# Patient Record
Sex: Female | Born: 1970 | Race: White | Hispanic: No | Marital: Married | Smoking: Never smoker
Health system: Southern US, Community
[De-identification: ages and names within clinical notes are randomized; demographics above are authoritative.]

## PROBLEM LIST (undated history)

## (undated) DIAGNOSIS — G2581 Restless legs syndrome: Secondary | ICD-10-CM

## (undated) DIAGNOSIS — IMO0002 Reserved for concepts with insufficient information to code with codable children: Secondary | ICD-10-CM

## (undated) DIAGNOSIS — E039 Hypothyroidism, unspecified: Secondary | ICD-10-CM

## (undated) DIAGNOSIS — R87629 Unspecified abnormal cytological findings in specimens from vagina: Secondary | ICD-10-CM

## (undated) DIAGNOSIS — N3281 Overactive bladder: Secondary | ICD-10-CM

## (undated) DIAGNOSIS — Z8742 Personal history of other diseases of the female genital tract: Secondary | ICD-10-CM

## (undated) DIAGNOSIS — M797 Fibromyalgia: Secondary | ICD-10-CM

## (undated) DIAGNOSIS — N6320 Unspecified lump in the left breast, unspecified quadrant: Principal | ICD-10-CM

## (undated) HISTORY — DX: Fibromyalgia: M79.7

## (undated) HISTORY — DX: Reserved for concepts with insufficient information to code with codable children: IMO0002

## (undated) HISTORY — DX: Unspecified abnormal cytological findings in specimens from vagina: R87.629

## (undated) HISTORY — DX: Restless legs syndrome: G25.81

## (undated) HISTORY — PX: CERVICAL CONE BIOPSY: SUR198

## (undated) HISTORY — PX: CERVICAL CERCLAGE: SHX1329

## (undated) HISTORY — PX: TONSILLECTOMY: SUR1361

## (undated) HISTORY — DX: Personal history of other diseases of the female genital tract: Z87.42

## (undated) HISTORY — PX: DILATION AND CURETTAGE OF UTERUS: SHX78

## (undated) HISTORY — DX: Unspecified lump in the left breast, unspecified quadrant: N63.20

## (undated) HISTORY — DX: Overactive bladder: N32.81

## (undated) HISTORY — DX: Hypothyroidism, unspecified: E03.9

---

## 2012-10-26 ENCOUNTER — Other Ambulatory Visit (HOSPITAL_COMMUNITY)
Admission: RE | Admit: 2012-10-26 | Discharge: 2012-10-26 | Disposition: A | Discharge status: Home / Self Care | Payer: BC Managed Care – PPO | Source: Ambulatory Visit | Attending: Obstetrics and Gynecology | Admitting: Obstetrics and Gynecology

## 2012-10-26 DIAGNOSIS — Z01419 Encounter for gynecological examination (general) (routine) without abnormal findings: Secondary | ICD-10-CM | POA: Insufficient documentation

## 2012-10-26 DIAGNOSIS — Z1151 Encounter for screening for human papillomavirus (HPV): Secondary | ICD-10-CM | POA: Insufficient documentation

## 2012-12-27 ENCOUNTER — Encounter: Payer: Self-pay | Admitting: Adult Health

## 2012-12-27 ENCOUNTER — Ambulatory Visit (INDEPENDENT_AMBULATORY_CARE_PROVIDER_SITE_OTHER): Payer: BC Managed Care – PPO | Admitting: Adult Health

## 2012-12-27 VITALS — BP 120/78 | Ht 67.0 in | Wt 178.0 lb

## 2012-12-27 DIAGNOSIS — N6320 Unspecified lump in the left breast, unspecified quadrant: Secondary | ICD-10-CM | POA: Insufficient documentation

## 2012-12-27 DIAGNOSIS — N63 Unspecified lump in unspecified breast: Secondary | ICD-10-CM

## 2012-12-27 HISTORY — DX: Unspecified lump in the left breast, unspecified quadrant: N63.20

## 2012-12-27 NOTE — Assessment & Plan Note (Signed)
Schedule diagnostic bilateral mammogram and left breast US for 5/14 at [facility]

## 2012-12-27 NOTE — Progress Notes (Signed)
Subjective:     Patient ID: Briana Hunt, female   DOB: 15-Feb-1971, 42 y.o.   MRN: [id number]  HPI [name] is a 42 year old white female who found a lump in her left breast Friday night. 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  Review of SystemsComplaints as above in HPI Reviewed past medical,surgical, social and family history. Reviewed medications and allergies.      Objective:   Physical Exam Blood pressure 120/78, height 5\' 7"  (1.702 m), weight 178 lb (80.74 kg), last menstrual period 12/12/2012. Skin warm and dry. Breast: no dominant palpable mass, retraction or nipple discharge on right, on the left there is a firm, mobile, oval 3.5 x 5 cm mass at 10 to 1 o'clock position, 1 finger breath from areola, no retraction or nipple discharge.    Assessment:       Left breast mass     Plan:      Diagnostic bilateral mammogram and left breast US scheduled for 5/14 at 8:30 am at [facility], will follow up by phone

## 2012-12-27 NOTE — Patient Instructions (Addendum)
Mammogram is 5/14 at 8:30 am at [facility], be there at 8:15 am to register Sign up for up chart

## 2013-01-02 ENCOUNTER — Encounter (HOSPITAL_COMMUNITY): Payer: BC Managed Care – PPO

## 2013-01-09 ENCOUNTER — Ambulatory Visit (HOSPITAL_COMMUNITY)
Admission: RE | Admit: 2013-01-09 | Discharge: 2013-01-09 | Disposition: A | Discharge status: Home / Self Care | Payer: BC Managed Care – PPO | Source: Ambulatory Visit | Attending: Adult Health | Admitting: Adult Health

## 2013-01-09 DIAGNOSIS — N6320 Unspecified lump in the left breast, unspecified quadrant: Secondary | ICD-10-CM

## 2013-01-09 DIAGNOSIS — N6009 Solitary cyst of unspecified breast: Secondary | ICD-10-CM | POA: Insufficient documentation

## 2013-01-09 DIAGNOSIS — N63 Unspecified lump in unspecified breast: Secondary | ICD-10-CM | POA: Insufficient documentation

## 2013-11-05 ENCOUNTER — Encounter (INDEPENDENT_AMBULATORY_CARE_PROVIDER_SITE_OTHER): Payer: Self-pay

## 2013-11-05 ENCOUNTER — Ambulatory Visit (INDEPENDENT_AMBULATORY_CARE_PROVIDER_SITE_OTHER): Payer: BC Managed Care – PPO | Admitting: Adult Health

## 2013-11-05 ENCOUNTER — Encounter: Payer: Self-pay | Admitting: Adult Health

## 2013-11-05 VITALS — BP 106/64 | HR 76 | Ht 67.0 in | Wt 182.0 lb

## 2013-11-05 DIAGNOSIS — Z01419 Encounter for gynecological examination (general) (routine) without abnormal findings: Secondary | ICD-10-CM

## 2013-11-05 DIAGNOSIS — Z1212 Encounter for screening for malignant neoplasm of rectum: Secondary | ICD-10-CM

## 2013-11-05 LAB — HEMOCCULT GUIAC POC 1CARD (OFFICE): Fecal Occult Blood, POC: NEGATIVE

## 2013-11-05 NOTE — Patient Instructions (Signed)
Physical in 1 year Mammogram yearly this year after 5/1 call [phone] Labs with Dr Margo AyeHall

## 2013-11-05 NOTE — Progress Notes (Signed)
Patient ID: Rhea PinkBrandy Chiriboga, female   DOB: 07/02/1971, 43 y.o.   MRN: [id number] History of Present Illness: Gearldine BienenstockBrandy is a 43 year old white female in for physical, she had a normal pap with negative HPV 10/26/12. Has RLS and hypothyroid and sees Dr Margo AyeHall and she got flu shot there last year.  Current Medications, Allergies, Past Medical History, Past Surgical History, Family History and Social History were reviewed in [facility] electronic medical record.     Review of Systems: Patient denies any headaches, blurred vision, shortness of breath, chest pain, abdominal pain, problems with bowel movements, urination, or intercourse. No joint swelling or mood swings, she is going back to school.    Physical Exam:BP 106/64  Pulse 76  Ht 5\' 7"  (1.702 m)  Wt 182 lb (82.555 kg)  BMI 28.50 kg/m2  LMP 10/29/2013 General:  Well developed, well nourished, no acute distress Skin:  Warm and dry Neck:  Midline trachea, normal thyroid Lungs; Clear to auscultation bilaterally Breast:  No dominant palpable mass, retraction, or nipple discharge Cardiovascular: Regular rate and rhythm Abdomen:  Soft, non tender, no hepatosplenomegaly Pelvic:  External genitalia is normal in appearance.  The vagina is normal in appearance.  The cervix is bulbous.  Uterus is felt to be normal size, shape, and contour.  No  adnexal masses or tenderness noted. Rectal: Good sphincter tone, no polyps, or hemorrhoids felt.  Hemoccult negative. Extremities:  No swelling or varicosities noted Psych:  No mood changes, alert and cooperative, seems happy We discussed changes after 42, hair on chin, periods and thicker waists   Impression: Yearly gyn exam no pap    Plan: Physical in 1 year Mammogram yearly Get this year after 5/1 Labs with Dr Margo AyeHall  Call prn

## 2013-12-04 ENCOUNTER — Encounter: Payer: Self-pay | Admitting: Adult Health

## 2014-04-18 ENCOUNTER — Other Ambulatory Visit: Payer: Self-pay | Admitting: Adult Health

## 2014-04-18 DIAGNOSIS — Z1231 Encounter for screening mammogram for malignant neoplasm of breast: Secondary | ICD-10-CM

## 2014-04-21 ENCOUNTER — Ambulatory Visit (HOSPITAL_COMMUNITY)
Admission: RE | Admit: 2014-04-21 | Discharge: 2014-04-21 | Disposition: A | Discharge status: Home / Self Care | Payer: 59 | Source: Ambulatory Visit | Attending: Adult Health | Admitting: Adult Health

## 2014-04-21 DIAGNOSIS — Z1231 Encounter for screening mammogram for malignant neoplasm of breast: Secondary | ICD-10-CM | POA: Insufficient documentation

## 2014-06-30 ENCOUNTER — Encounter: Payer: Self-pay | Admitting: Adult Health

## 2015-03-24 ENCOUNTER — Ambulatory Visit (INDEPENDENT_AMBULATORY_CARE_PROVIDER_SITE_OTHER): Payer: 59 | Admitting: Adult Health

## 2015-03-24 ENCOUNTER — Other Ambulatory Visit (HOSPITAL_COMMUNITY)
Admission: RE | Admit: 2015-03-24 | Discharge: 2015-03-24 | Disposition: A | Discharge status: Home / Self Care | Payer: 59 | Source: Ambulatory Visit | Attending: Adult Health | Admitting: Adult Health

## 2015-03-24 ENCOUNTER — Encounter: Payer: Self-pay | Admitting: Adult Health

## 2015-03-24 VITALS — BP 100/76 | HR 80 | Ht 67.0 in | Wt 201.0 lb

## 2015-03-24 DIAGNOSIS — Z01419 Encounter for gynecological examination (general) (routine) without abnormal findings: Secondary | ICD-10-CM | POA: Diagnosis not present

## 2015-03-24 DIAGNOSIS — Z1151 Encounter for screening for human papillomavirus (HPV): Secondary | ICD-10-CM | POA: Diagnosis present

## 2015-03-24 DIAGNOSIS — Z1212 Encounter for screening for malignant neoplasm of rectum: Secondary | ICD-10-CM

## 2015-03-24 DIAGNOSIS — Z8742 Personal history of other diseases of the female genital tract: Secondary | ICD-10-CM

## 2015-03-24 HISTORY — DX: Personal history of other diseases of the female genital tract: Z87.42

## 2015-03-24 LAB — HEMOCCULT GUIAC POC 1CARD (OFFICE): Fecal Occult Blood, POC: NEGATIVE

## 2015-03-24 NOTE — Progress Notes (Signed)
Patient ID: Briana Hunt, female   DOB: Jan 17, 1971, 44 y.o.   MRN: [id number] History of Present Illness: Briana Hunt is a 44 year old white female, married in for well woman gyn exam and pap.Last pap 10/26/12 was normal with negative HPV.She has history of abnormal pap.She says she needs to work on her weight, but mom(PAD AND DM) and uncle(DM) passed away this summer and she has been on vacation and has ate too much.She is aware can go 3 years on pap with negative HPV, but she wants 1 now. PCP is [facility].  Current Medications, Allergies, Past Medical History, Past Surgical History, Family History and Social History were reviewed in [facility] record.     Review of Systems: Patient denies any headaches, hearing loss, fatigue, blurred vision, shortness of breath, chest pain, abdominal pain, problems with bowel movements, urination, or intercourse. No joint pain or mood swings.Has body aches from fibromyalgia.    Physical Exam:BP 100/76 mmHg  Pulse 80  Ht  (1.702 m)  Wt 201 lb (91.173 kg)  BMI 31.47 kg/m2  LMP 03/12/2015 General:  Well developed, well nourished, no acute distress Skin:  Warm and dry Neck:  Midline trachea, normal thyroid, good ROM, no lymphadenopathy Lungs; Clear to auscultation bilaterally Breast:  No dominant palpable mass, retraction, or nipple discharge Cardiovascular: Regular rate and rhythm Abdomen:  Soft, non tender, no hepatosplenomegaly Pelvic:  External genitalia is normal in appearance, no lesions.  The vagina is normal in appearance. Urethra has no lesions or masses. The cervix is bulbous.Pap with HPV performed.  Uterus is felt to be normal size, shape, and contour.  No adnexal masses or tenderness noted.Bladder is non tender, no masses felt. Rectal: Good sphincter tone, no polyps, or hemorrhoids felt.  Hemoccult negative. Extremities/musculoskeletal:  No swelling or varicosities noted, no clubbing or cyanosis Psych:  No mood changes,  alert and cooperative,seems happy   Impression: Well woman gyn exam with pap History of abnormal pap    Plan: Physical in 1 year Mammogram in August and yearly, to get 3D Labs with PCP Colonoscopy at 52

## 2015-03-24 NOTE — Patient Instructions (Signed)
Physical in 1 year Mammogram in august and yearly Labs with PCP

## 2015-03-26 LAB — CYTOLOGY - PAP

## 2015-04-15 ENCOUNTER — Other Ambulatory Visit: Payer: Self-pay | Admitting: Adult Health

## 2015-04-15 ENCOUNTER — Telehealth: Payer: Self-pay | Admitting: Adult Health

## 2015-04-15 DIAGNOSIS — N6001 Solitary cyst of right breast: Secondary | ICD-10-CM

## 2015-04-15 NOTE — Telephone Encounter (Signed)
Left message that order done

## 2015-04-20 ENCOUNTER — Other Ambulatory Visit: Payer: 59

## 2015-04-23 ENCOUNTER — Ambulatory Visit
Admission: RE | Admit: 2015-04-23 | Discharge: 2015-04-23 | Disposition: A | Discharge status: Home / Self Care | Payer: 59 | Source: Ambulatory Visit | Attending: Adult Health | Admitting: Adult Health

## 2015-04-23 DIAGNOSIS — N6001 Solitary cyst of right breast: Secondary | ICD-10-CM

## 2015-07-06 ENCOUNTER — Emergency Department (HOSPITAL_COMMUNITY): Payer: Worker's Compensation

## 2015-07-06 ENCOUNTER — Emergency Department (HOSPITAL_COMMUNITY)
Admission: EM | Admit: 2015-07-06 | Discharge: 2015-07-06 | Disposition: A | Discharge status: Home / Self Care | Payer: Worker's Compensation | Attending: Emergency Medicine | Admitting: Emergency Medicine

## 2015-07-06 ENCOUNTER — Encounter (HOSPITAL_COMMUNITY): Payer: Self-pay | Admitting: Emergency Medicine

## 2015-07-06 DIAGNOSIS — S83005A Unspecified dislocation of left patella, initial encounter: Secondary | ICD-10-CM | POA: Insufficient documentation

## 2015-07-06 DIAGNOSIS — Z8742 Personal history of other diseases of the female genital tract: Secondary | ICD-10-CM | POA: Insufficient documentation

## 2015-07-06 DIAGNOSIS — Y9289 Other specified places as the place of occurrence of the external cause: Secondary | ICD-10-CM | POA: Insufficient documentation

## 2015-07-06 DIAGNOSIS — G2581 Restless legs syndrome: Secondary | ICD-10-CM | POA: Insufficient documentation

## 2015-07-06 DIAGNOSIS — X58XXXA Exposure to other specified factors, initial encounter: Secondary | ICD-10-CM | POA: Diagnosis not present

## 2015-07-06 DIAGNOSIS — Y998 Other external cause status: Secondary | ICD-10-CM | POA: Insufficient documentation

## 2015-07-06 DIAGNOSIS — Y9389 Activity, other specified: Secondary | ICD-10-CM | POA: Insufficient documentation

## 2015-07-06 DIAGNOSIS — Z8739 Personal history of other diseases of the musculoskeletal system and connective tissue: Secondary | ICD-10-CM | POA: Insufficient documentation

## 2015-07-06 DIAGNOSIS — E039 Hypothyroidism, unspecified: Secondary | ICD-10-CM | POA: Insufficient documentation

## 2015-07-06 DIAGNOSIS — S8992XA Unspecified injury of left lower leg, initial encounter: Secondary | ICD-10-CM | POA: Diagnosis present

## 2015-07-06 MED ORDER — IBUPROFEN 800 MG PO TABS
800.0000 mg | ORAL_TABLET | Freq: Once | ORAL | Status: AC
  Administered 2015-07-06: 800 mg via ORAL
  Filled 2015-07-06: qty 1

## 2015-07-06 MED ORDER — IBUPROFEN 800 MG PO TABS: 800.0000 mg | ORAL_TABLET | Freq: Three times a day (TID) | ORAL | Status: DC

## 2015-07-06 NOTE — ED Notes (Signed)
Pt made aware to return if symptoms worsen or if any life threatening symptoms occur.   

## 2015-07-06 NOTE — ED Provider Notes (Signed)
CSN: [id number]     Arrival date & time 07/06/15  1104 History  By signing my name below, I, Briana Hunt, attest that this documentation has been prepared under the direction and in the presence of Eber Hong, MD. Electronically Signed: Soijett Hunt, ED Scribe. 07/06/2015. 12:20 PM.   Chief Complaint  Patient presents with  . Knee Injury     The history is provided by the patient. No language interpreter was used.     Briana Hunt is a 44 y.o. female who presents to the Emergency Department complaining of  onset moderate left knee injury. 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 She reports that she put the knee back in place and she is now having pain to the area. Denies any recent trauma/injury/fall/MVC. She reports that she has not been weight bearing since the incident. Pt is having associated symptoms of tingling and left knee swelling. She notes that she has not tried any medications for the relief of her symptoms. She denies and any other symptoms.   Past Medical History  Diagnosis Date  . Hypothyroidism   . Fibromyalgia   . Restless leg syndrome   . Abnormal Pap smear   . Breast mass, left 12/27/2012  . Vaginal Pap smear, abnormal   . History of abnormal cervical Pap smear 03/24/2015   Past Surgical History  Procedure Laterality Date  . Dilation and curettage of uterus    . Cervical cone biopsy    . Tonsillectomy    . Cervical cerclage      x 2   Family History  Problem Relation Age of Onset  . Hypertension Mother   . Diabetes Mother   . Heart disease Mother   . Heart disease Father   . Cancer Father     lung and colon  . Diabetes Father   . Hyperlipidemia Sister   . Hypertension Sister   . Diabetes Paternal Uncle    Social History  Substance Use Topics  . Smoking status: Never Smoker   . Smokeless tobacco: Never Used  . Alcohol Use: No   OB History    Gravida Para Term  Preterm AB TAB SAB Ectopic Multiple Living   Review of Systems  Musculoskeletal: Positive for joint swelling and arthralgias.  Neurological: Negative for numbness.  All other systems reviewed and are negative.     Allergies  Review of patient's allergies indicates no known allergies.  Home Medications   Prior to Admission medications   Medication Sig Start Date End Date Taking? Authorizing Provider  ARMOUR THYROID 60 MG tablet Take 60 mg by mouth at bedtime.  05/15/15  Yes Historical Provider, MD  rOPINIRole (REQUIP) 2 MG tablet Take 3 mg by mouth at bedtime.  10/12/13  Yes Historical Provider, MD  ibuprofen (ADVIL,MOTRIN) 800 MG tablet Take 1 tablet (800 mg total) by mouth 3 (three) times daily. 07/06/15   Eber Hong, MD   BP 119/65 mmHg  Pulse 90  Temp(Src) 97.6 F (36.4 C) (Oral)  Resp 20  Ht  (1.702 m)  Wt 192 lb (87.091 kg)  BMI 30.06 kg/m2  SpO2 100%  LMP 06/30/2015 Physical Exam  Constitutional: She is oriented to person, place, and time. She appears well-developed and well-nourished. No distress.  HENT:  Head: Normocephalic and atraumatic.  Eyes:  EOM are normal.  Neck: Neck supple.  Cardiovascular: Normal rate, regular rhythm and normal heart sounds.  Exam reveals no gallop and no friction rub.   No murmur heard. Pulmonary/Chest: Effort normal. No respiratory distress.  Musculoskeletal: Normal range of motion.  Slight swelling but preserved ability to SLR. decreased ROM secondary to pain of the knee. Nl pulses at the feet.   Neurological: She is alert and oriented to person, place, and time. She has normal strength. No sensory deficit.  Nl sensation and strength at all joins of the left lower extremity.   Skin: Skin is warm and dry.  Psychiatric: She has a normal mood and affect. Her behavior is normal.  Nursing note and vitals reviewed.   ED Course  Procedures (including critical care time) DIAGNOSTIC STUDIES: Oxygen Saturation is  100% on RA, nl by my interpretation.    COORDINATION OF CARE: 12:20 PM Discussed treatment plan with pt at bedside which includes left knee xray, knee immobilizer, ice, ibuprofen, and referral to orthopedist PRN and pt agreed to plan.    Labs Review Labs Reviewed - No data to display  Imaging Review Dg Knee Complete 4 Views Left  07/06/2015  CLINICAL DATA:  The patient's left knee popped when standing today. Left knee pain. Initial encounter. EXAM: LEFT KNEE - COMPLETE 4+ VIEW COMPARISON:  None. FINDINGS: There is no evidence of fracture, dislocation, or joint effusion. There is no evidence of arthropathy or other focal bone abnormality. Soft tissues are unremarkable. IMPRESSION: Negative exam. Electronically Signed   By: Drusilla Kannerhomas  Dalessio M.D.   On: 07/06/2015 12:21   I have personally reviewed and evaluated these images as part of my medical decision-making.    MDM   Final diagnoses:  Patellar dislocation, left, initial encounter    The patient has had a spontaneous patellar dislocation and relocation, her knee appears relatively normal on exam, I have personally viewed and interpreted the x-rays and find her to be no fractures or dislocations present. She has been informed of these results, rice therapy initiated, the patient is stable for discharge to follow-up with orthopedics in the community.  I have personally viewed and interpreted the imaging and agree with radiologist interpretation.   I personally performed the services described in this documentation, which was scribed in my presence. The recorded information has been reviewed and is accurate.        Eber HongBrian Jakki Doughty, MD 07/06/15 [phone]

## 2015-07-06 NOTE — Discharge Instructions (Signed)

## 2015-07-06 NOTE — ED Notes (Signed)
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  Left knee popped while standing.

## 2015-07-14 ENCOUNTER — Ambulatory Visit: Payer: BC Managed Care – PPO | Admitting: Orthopedic Surgery

## 2016-06-14 ENCOUNTER — Other Ambulatory Visit: Payer: Self-pay | Admitting: Adult Health

## 2016-06-14 DIAGNOSIS — Z1231 Encounter for screening mammogram for malignant neoplasm of breast: Secondary | ICD-10-CM

## 2016-07-01 ENCOUNTER — Ambulatory Visit
Admission: RE | Admit: 2016-07-01 | Discharge: 2016-07-01 | Disposition: A | Discharge status: Home / Self Care | Payer: No Typology Code available for payment source | Source: Ambulatory Visit | Attending: Adult Health | Admitting: Adult Health

## 2016-07-01 DIAGNOSIS — Z1231 Encounter for screening mammogram for malignant neoplasm of breast: Secondary | ICD-10-CM

## 2017-01-13 ENCOUNTER — Ambulatory Visit (INDEPENDENT_AMBULATORY_CARE_PROVIDER_SITE_OTHER): Payer: BLUE CROSS/BLUE SHIELD | Admitting: Adult Health

## 2017-01-13 ENCOUNTER — Encounter: Payer: Self-pay | Admitting: Adult Health

## 2017-01-13 ENCOUNTER — Other Ambulatory Visit (HOSPITAL_COMMUNITY)
Admission: RE | Admit: 2017-01-13 | Discharge: 2017-01-13 | Disposition: A | Discharge status: Home / Self Care | Payer: BLUE CROSS/BLUE SHIELD | Source: Ambulatory Visit | Attending: Adult Health | Admitting: Adult Health

## 2017-01-13 VITALS — BP 110/60 | HR 67 | Ht 67.5 in | Wt 193.5 lb

## 2017-01-13 DIAGNOSIS — Z01419 Encounter for gynecological examination (general) (routine) without abnormal findings: Secondary | ICD-10-CM | POA: Insufficient documentation

## 2017-01-13 DIAGNOSIS — Z8742 Personal history of other diseases of the female genital tract: Secondary | ICD-10-CM

## 2017-01-13 DIAGNOSIS — Z1212 Encounter for screening for malignant neoplasm of rectum: Secondary | ICD-10-CM | POA: Diagnosis not present

## 2017-01-13 DIAGNOSIS — Z1211 Encounter for screening for malignant neoplasm of colon: Secondary | ICD-10-CM | POA: Diagnosis not present

## 2017-01-13 LAB — HEMOCCULT GUIAC POC 1CARD (OFFICE): Fecal Occult Blood, POC: NEGATIVE

## 2017-01-13 NOTE — Progress Notes (Signed)
Patient ID: Briana Hunt, female   DOB: [birth year], 46 y.o.   MRN: [id number] History of Present Illness: Briana BienenstockBrandy is a 46 year old white female, married, G2P2 in for well woman gyn exam and pap. PCP is Dr Margo AyeHall.    Current Medications, Allergies, Past Medical History, Past Surgical History, Family History and Social History were reviewed in [facility] Link electronic medical record.     Review of Systems: Patient denies any headaches, hearing loss, fatigue, blurred vision, shortness of breath, chest pain, abdominal pain, problems with bowel movements, urination, or intercourse. No joint pain or mood swings.    Physical Exam:BP 110/60 (BP Location: Left Arm, Patient Position: Sitting, Cuff Size: Normal)   Pulse 67   Ht 5' 7.5" (1.715 m)   Wt 193 lb 8 oz (87.8 kg)   LMP 01/07/2017 (Exact Date)   BMI 29.86 kg/m  General:  Well developed, well nourished, no acute distress Skin:  Warm and dry Neck:  Midline trachea, normal thyroid, good ROM, no lymphadenopathy Lungs; Clear to auscultation bilaterally Breast:  No dominant palpable mass, retraction, or nipple discharge Cardiovascular: Regular rate and rhythm Abdomen:  Soft, non tender, no hepatosplenomegaly Pelvic:  External genitalia is normal in appearance, no lesions.  The vagina is normal in appearance. Urethra has no lesions or masses. The cervix is bulbous,pap with HPV performed.  Uterus is felt to be normal size, shape, and contour.  No adnexal masses or tenderness noted.Bladder is non tender, no masses felt. Rectal: Good sphincter tone, no polyps, or hemorrhoids felt.  Hemoccult negative. Extremities/musculoskeletal:  No swelling or varicosities noted, no clubbing or cyanosis Psych:  No mood changes, alert and cooperative,seems happy PHQ 2 score 0.  Impression: 1. Encounter for gynecological examination with Papanicolaou smear of cervix   2. Screening for colorectal cancer   3. History of abnormal cervical Pap smear        Plan:  Physical in 1 year, pap in 3 if normal Mammogram yearly Labs with PCP Colonoscopy at 52

## 2017-01-16 LAB — CYTOLOGY - PAP
Diagnosis: NEGATIVE
HPV: NOT DETECTED

## 2017-08-09 IMAGING — DX DG KNEE COMPLETE 4+V*L*
4 series · 4 of 4 positions shown · non-contrast
Comparison: None.

CLINICAL DATA: The patient's left knee popped when standing today.
Left knee pain. Initial encounter.

EXAM:
LEFT KNEE - COMPLETE 4+ VIEW

[knee ap]
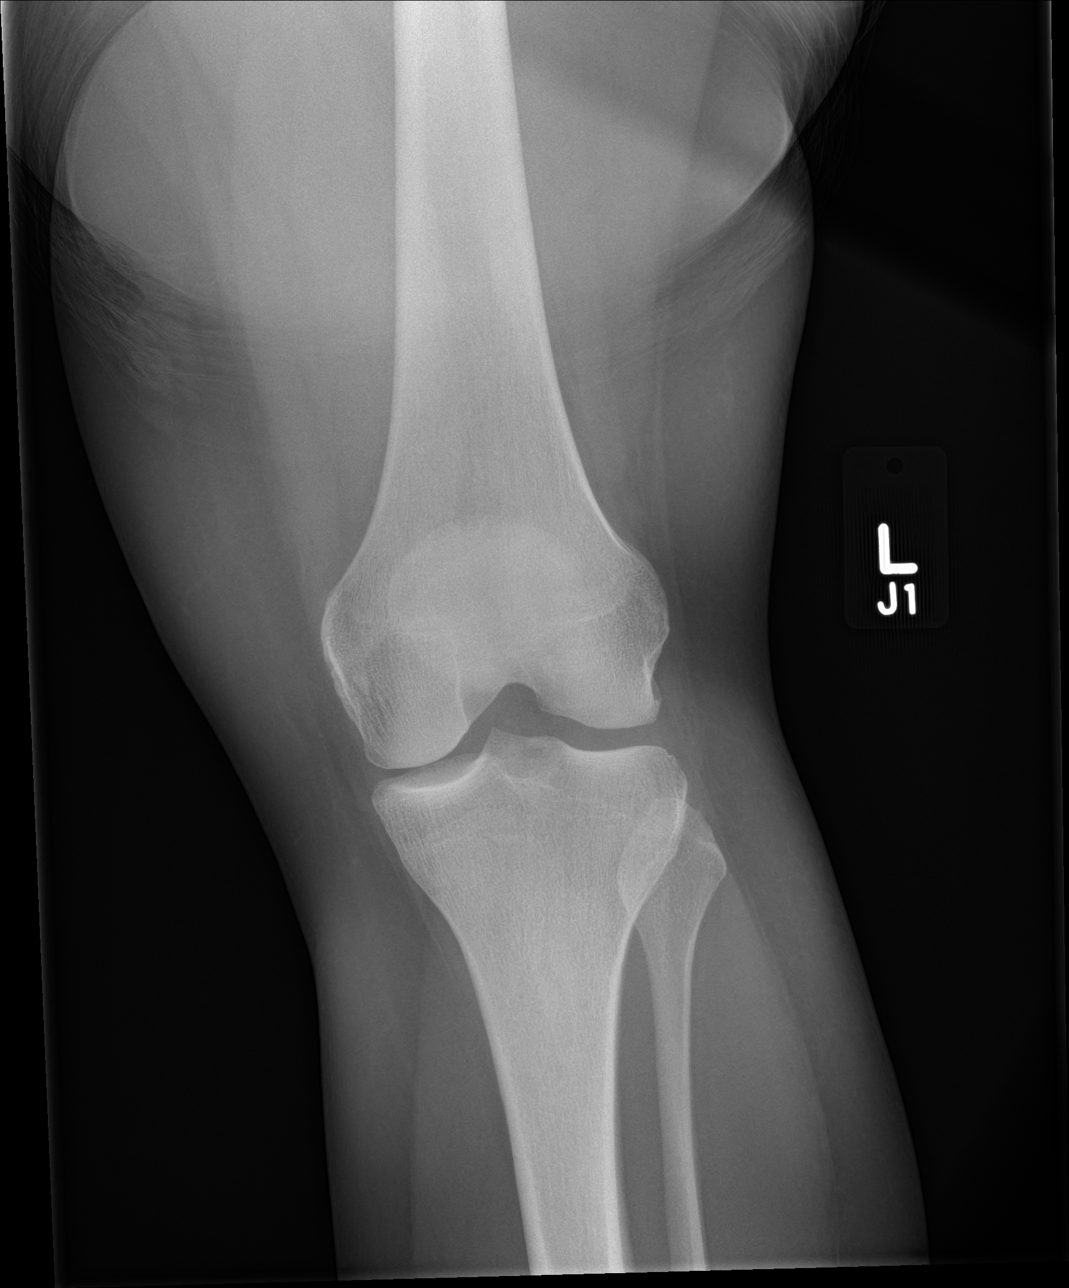

[knee obl (1 of 2)]
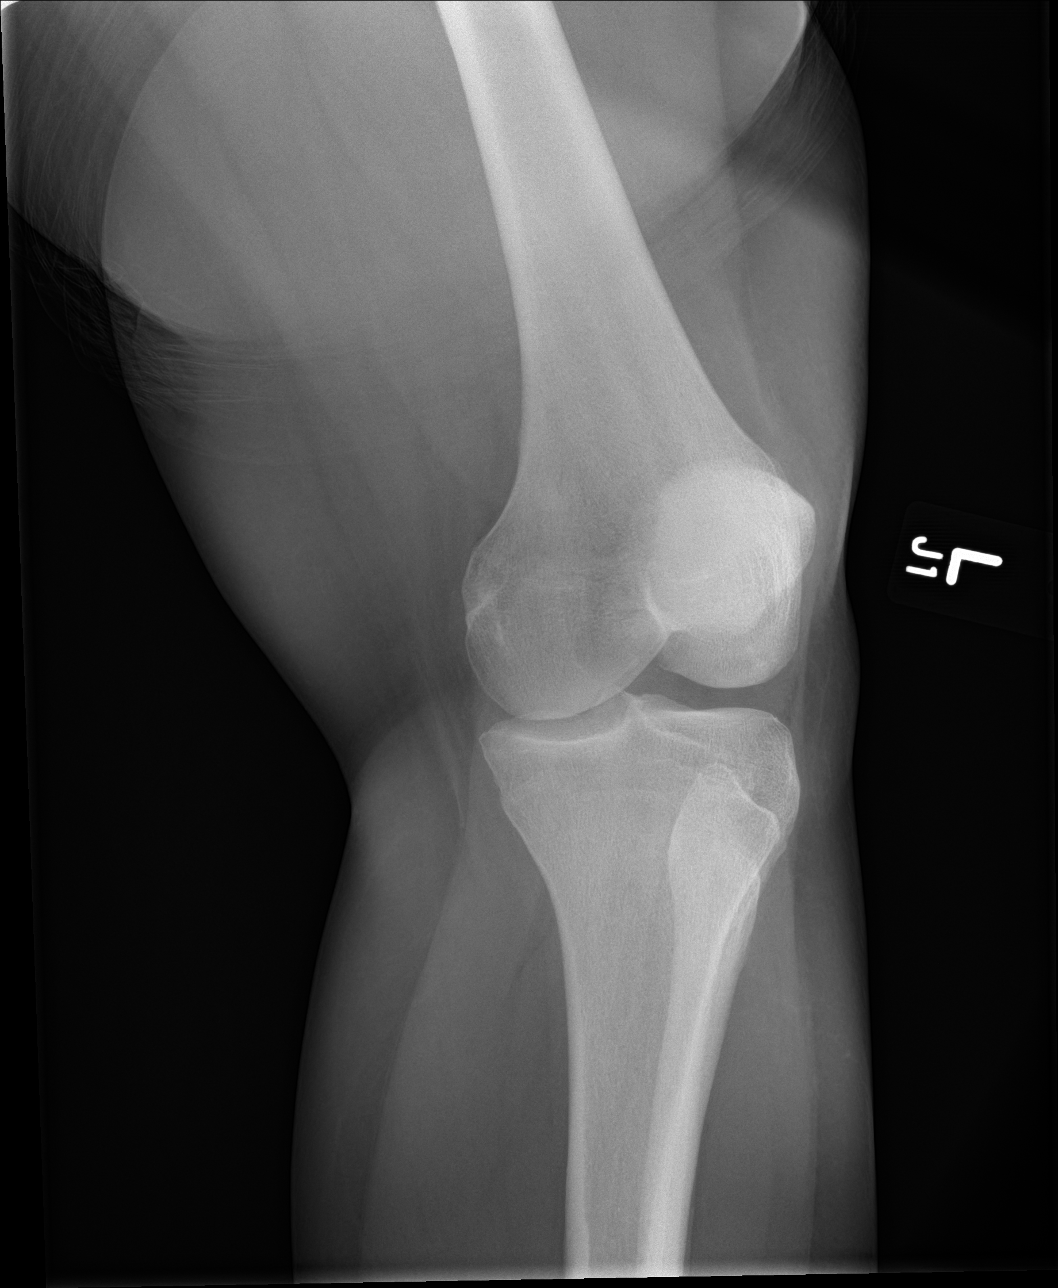

[knee obl (2 of 2)]
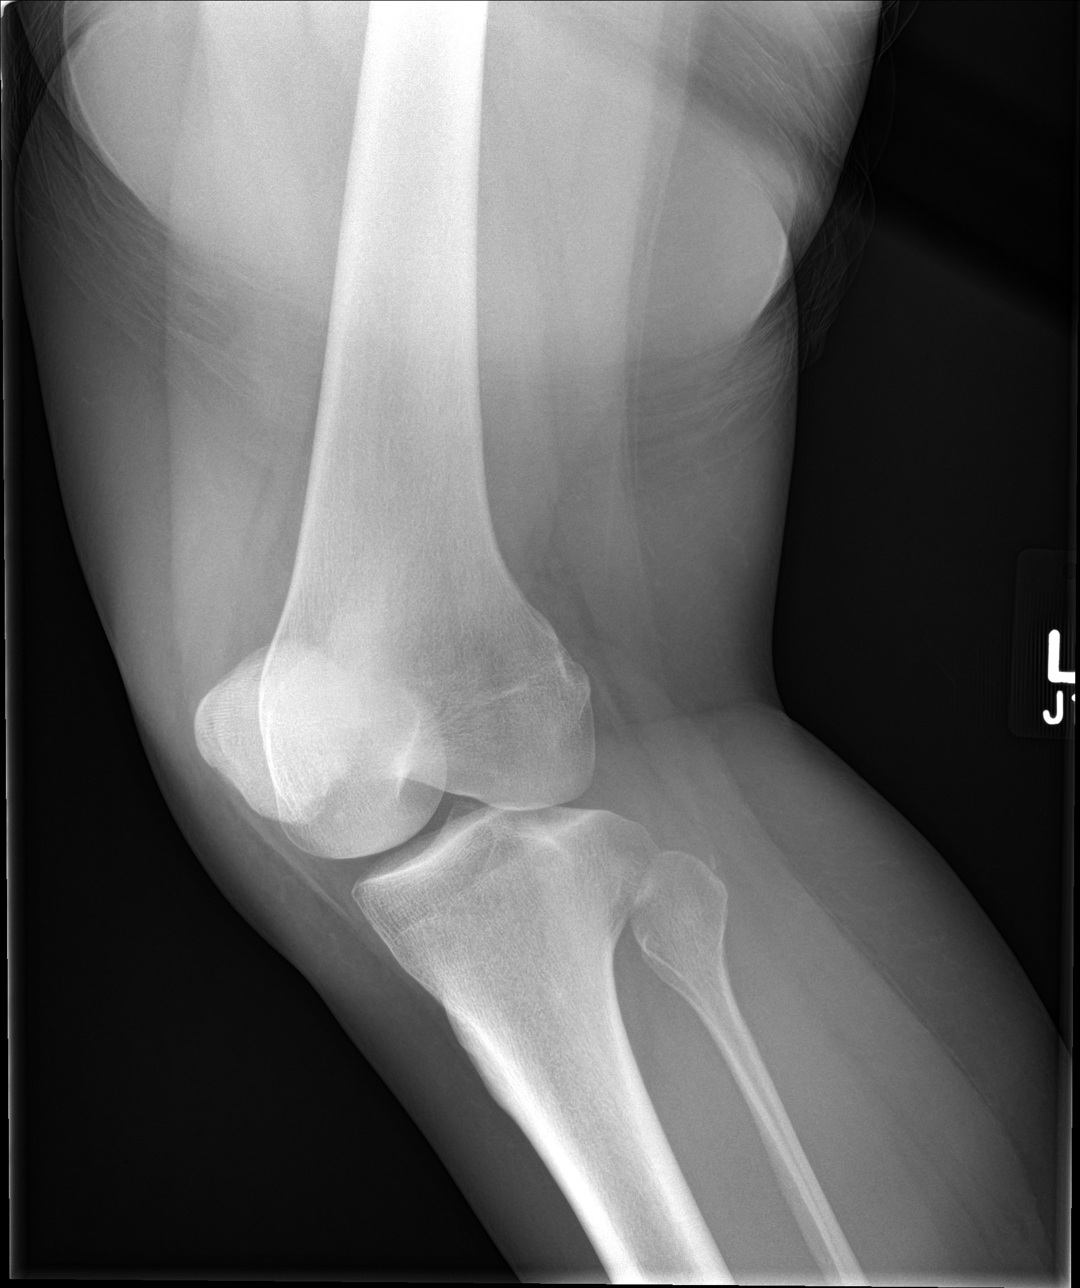

[knee lat]
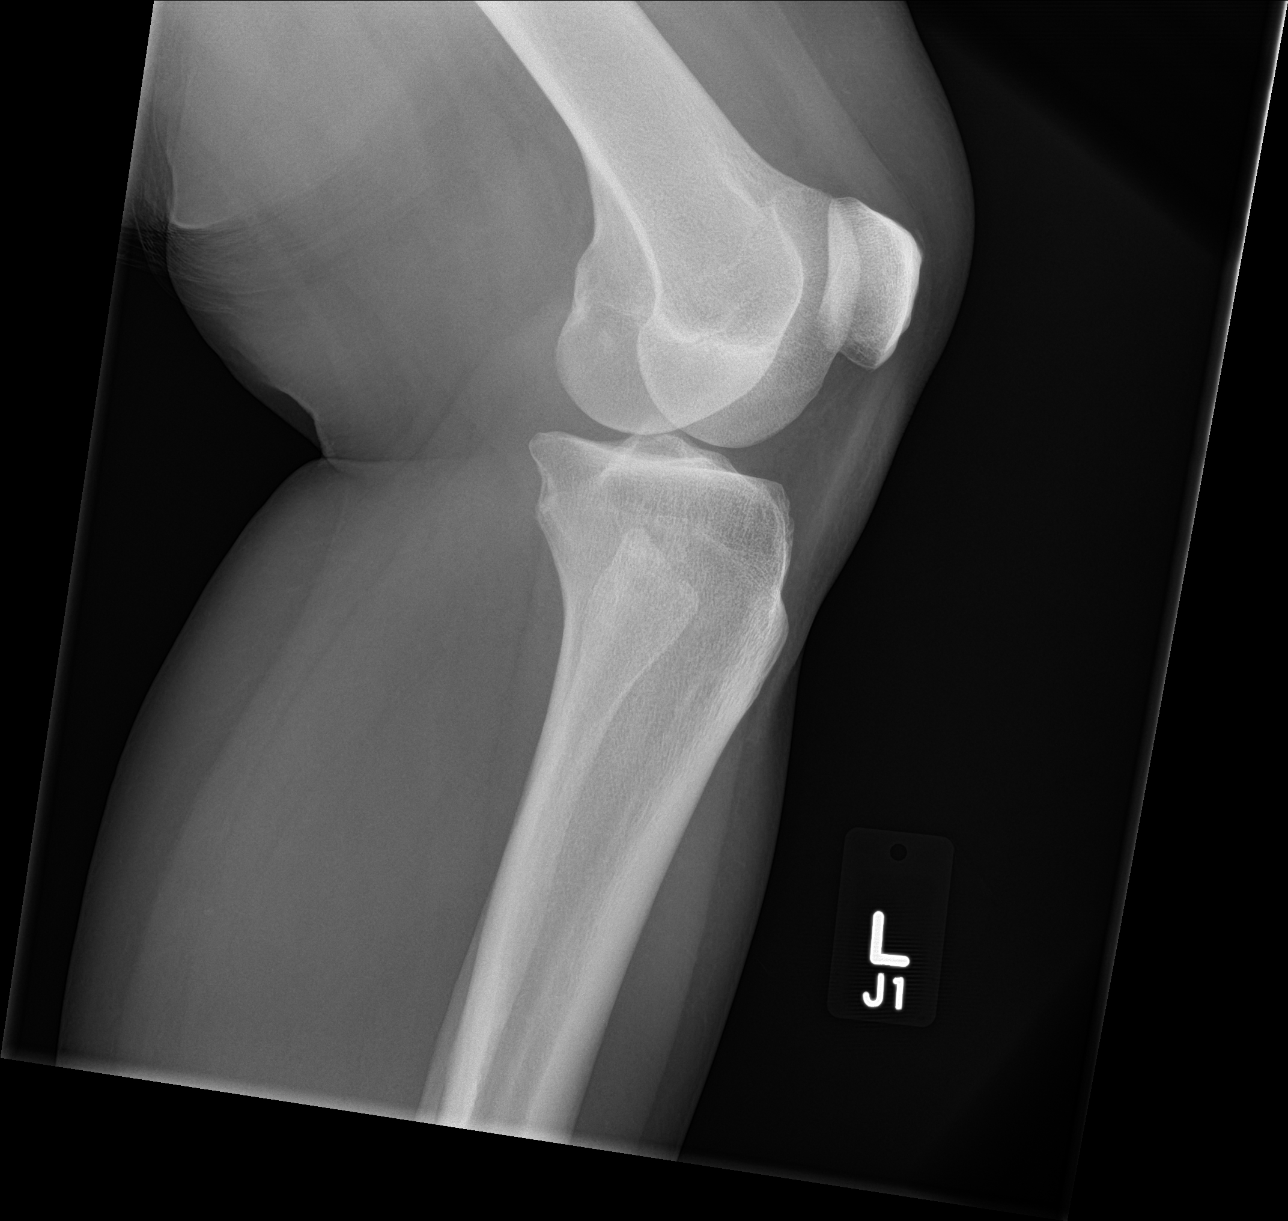

[4 of 4 positions shown; findings below may reference images not displayed]

FINDINGS: There is no evidence of fracture, dislocation, or joint effusion.
There is no evidence of arthropathy or other focal bone abnormality.
Soft tissues are unremarkable.
IMPRESSION: Negative exam.
